# Patient Record
Sex: Female | Born: 1940 | Race: White | Hispanic: No | Smoking: Former smoker
Health system: Southern US, Community
[De-identification: ages and names within clinical notes are randomized; demographics above are authoritative.]

## PROBLEM LIST (undated history)

## (undated) DIAGNOSIS — I351 Nonrheumatic aortic (valve) insufficiency: Secondary | ICD-10-CM

## (undated) HISTORY — PX: APPENDECTOMY: SHX54

## (undated) HISTORY — PX: ADENOIDECTOMY: SUR15

## (undated) HISTORY — PX: FOOT SURGERY: SHX648

## (undated) HISTORY — PX: TONSILLECTOMY: SUR1361

---

## 2016-02-04 ENCOUNTER — Emergency Department (HOSPITAL_COMMUNITY)
Admission: EM | Admit: 2016-02-04 | Discharge: 2016-02-04 | Disposition: A | Discharge status: Home / Self Care | Payer: Medicare Other | Attending: Emergency Medicine | Admitting: Emergency Medicine

## 2016-02-04 ENCOUNTER — Emergency Department (HOSPITAL_COMMUNITY): Payer: Medicare Other

## 2016-02-04 ENCOUNTER — Encounter (HOSPITAL_COMMUNITY): Payer: Self-pay

## 2016-02-04 DIAGNOSIS — Y998 Other external cause status: Secondary | ICD-10-CM | POA: Insufficient documentation

## 2016-02-04 DIAGNOSIS — W101XXA Fall (on)(from) sidewalk curb, initial encounter: Secondary | ICD-10-CM | POA: Insufficient documentation

## 2016-02-04 DIAGNOSIS — Z8679 Personal history of other diseases of the circulatory system: Secondary | ICD-10-CM | POA: Diagnosis not present

## 2016-02-04 DIAGNOSIS — S60511A Abrasion of right hand, initial encounter: Secondary | ICD-10-CM | POA: Insufficient documentation

## 2016-02-04 DIAGNOSIS — Z23 Encounter for immunization: Secondary | ICD-10-CM | POA: Insufficient documentation

## 2016-02-04 DIAGNOSIS — T148XXA Other injury of unspecified body region, initial encounter: Secondary | ICD-10-CM

## 2016-02-04 DIAGNOSIS — Z87891 Personal history of nicotine dependence: Secondary | ICD-10-CM | POA: Diagnosis not present

## 2016-02-04 DIAGNOSIS — Y92481 Parking lot as the place of occurrence of the external cause: Secondary | ICD-10-CM | POA: Insufficient documentation

## 2016-02-04 DIAGNOSIS — Y9301 Activity, walking, marching and hiking: Secondary | ICD-10-CM | POA: Diagnosis not present

## 2016-02-04 DIAGNOSIS — S6991XA Unspecified injury of right wrist, hand and finger(s), initial encounter: Secondary | ICD-10-CM | POA: Diagnosis present

## 2016-02-04 DIAGNOSIS — W19XXXA Unspecified fall, initial encounter: Secondary | ICD-10-CM

## 2016-02-04 DIAGNOSIS — S4992XA Unspecified injury of left shoulder and upper arm, initial encounter: Secondary | ICD-10-CM | POA: Insufficient documentation

## 2016-02-04 DIAGNOSIS — M79602 Pain in left arm: Secondary | ICD-10-CM

## 2016-02-04 HISTORY — DX: Nonrheumatic aortic (valve) insufficiency: I35.1

## 2016-02-04 MED ORDER — TETANUS-DIPHTH-ACELL PERTUSSIS 5-2.5-18.5 LF-MCG/0.5 IM SUSP
0.5000 mL | Freq: Once | INTRAMUSCULAR | Status: AC
  Administered 2016-02-04: 0.5 mL via INTRAMUSCULAR
  Filled 2016-02-04: qty 0.5

## 2016-02-04 MED ORDER — BACITRACIN ZINC 500 UNIT/GM EX OINT
TOPICAL_OINTMENT | Freq: Two times a day (BID) | CUTANEOUS | Status: DC
  Administered 2016-02-04: 1 via TOPICAL
  Filled 2016-02-04: qty 0.9

## 2016-02-04 MED ORDER — ACETAMINOPHEN 325 MG PO TABS
650.0000 mg | ORAL_TABLET | Freq: Once | ORAL | Status: AC
  Administered 2016-02-04: 650 mg via ORAL
  Filled 2016-02-04: qty 2

## 2016-02-04 NOTE — ED Notes (Signed)
Pt here with fall 20 min ago.  Pt fell in parking lot.  Pt fell stepping over curb.  Landed on dirt.  Scrape to rt hand.  Pt also c/o pain to left arm.  No head injury.  No LOC

## 2016-02-04 NOTE — ED Provider Notes (Signed)
CSN: [id number]     Arrival date & time 02/04/16  4696 History   First MD Initiated Contact with Patient 02/04/16 1017     Chief Complaint  Patient presents with  . Fall  . Arm Injury     (Consider location/radiation/quality/duration/timing/severity/associated sxs/prior Treatment) Patient is a 75 y.o. female presenting with fall and arm injury.  Fall Associated symptoms include arthralgias and myalgias. Pertinent negatives include no abdominal pain, chest pain, coughing, fever, headaches, nausea, neck pain, numbness, urinary symptoms, visual change, vomiting or weakness.  Arm Injury Associated symptoms: no fever and no neck pain    Samantha Lopez is a 75 y.o. female with PMH significant for aortic valve insufficiency who presents with fall just prior to arrival. Patient states she was walking in the parking lot to visit a friend here at the hospital when she accidentally tripped over a rock while looking up at a building. She reports she landed on her knees and hands. No head injury or loss of consciousness. She now presents with a superficial wound to her right palm and constant, moderate, unchanging left arm pain. No modifying factors. No meds PTA. She denies fever, chills, chest pain, dizziness, syncope, shortness of breath.  She is unsure of her last tetanus shot.    Past Medical History  Diagnosis Date  . Aortic valve insufficiency    Past Surgical History  Procedure Laterality Date  . Appendectomy    . Foot surgery    . Tonsillectomy    . Adenoidectomy     History reviewed. No pertinent family history. Social History  Substance Use Topics  . Smoking status: Former Games developer  . Smokeless tobacco: None  . Alcohol Use: Yes     Comment: social   OB History    No data available     Review of Systems  Constitutional: Negative for fever.  Respiratory: Negative for cough.   Cardiovascular: Negative for chest pain.  Gastrointestinal: Negative for nausea, vomiting and  abdominal pain.  Musculoskeletal: Positive for myalgias and arthralgias. Negative for neck pain.  Skin: Positive for wound.  Neurological: Negative for weakness, numbness and headaches.  All other systems reviewed and are negative.     Allergies  Review of patient's allergies indicates no known allergies.  Home Medications   Prior to Admission medications   Not on File   BP 160/79 mmHg  Pulse 68  Temp(Src) 97.8 F (36.6 C) (Oral)  Resp 18  SpO2 98% Physical Exam  Constitutional: She is oriented to person, place, and time. She appears well-developed and well-nourished.  HENT:  Head: Normocephalic and atraumatic.  Right Ear: External ear normal.  Left Ear: External ear normal.  Eyes: Conjunctivae are normal. No scleral icterus.  Neck: No tracheal deviation present.  Cardiovascular:  Pulses:      Radial pulses are 2+ on the right side, and 2+ on the left side.  Capillary refill less than 3 second x 10.  Pulmonary/Chest: Effort normal. No respiratory distress.  Abdominal: She exhibits no distension.  Musculoskeletal:       Right shoulder: Normal.       Left shoulder: She exhibits decreased range of motion (secondary to pain), tenderness and pain. She exhibits no bony tenderness, no swelling, no crepitus, no deformity, no spasm, normal pulse and normal strength.       Right elbow: Normal.      Left elbow: Normal. No tenderness found. No radial head, no medial epicondyle, no lateral epicondyle and no olecranon process  tenderness noted.       Right wrist: She exhibits normal range of motion, no tenderness, no bony tenderness, no swelling and no deformity.       Left wrist: Normal.       Right knee: Normal.       Left knee: Normal.  Left shoulder: -No obvious deformities, swelling, ecchymosis, abrasions, or erythema. -No bony tenderness over clavicle, AC joint, or humeral head.  Mild tenderness over deltoid muscle. -Decreased AROM in flexion and abduction secondary to pain.   Neurological: She is alert and oriented to person, place, and time.  Strength and sensation intact bilaterally in upper extremities.   Skin: Skin is warm and dry.  Superficial abrasion noted on right proximal palm. No signs of infection. No foreign bodies visualized or palpated. Superficial linear abrasion seen on the left elbow without signs of infection, and no active bleeding.  Psychiatric: She has a normal mood and affect. Her behavior is normal.    ED Course  Procedures (including critical care time) Labs Review Labs Reviewed - No data to display  Imaging Review Dg Shoulder Left  02/04/2016  CLINICAL DATA:  Status post trip and fall this morning with a left shoulder injury. Pain. Initial encounter. EXAM: LEFT SHOULDER - 2+ VIEW COMPARISON:  None. FINDINGS: There is no acute bony or joint abnormality. No notable degenerative changes seen about the shoulder. Imaged left lung and ribs are clear. IMPRESSION: Negative exam. Electronically Signed   By: Drusilla Kannerhomas  Dalessio M.D.   On: 02/04/2016 10:55   Dg Humerus Left  02/04/2016  CLINICAL DATA:  Tripped and fell in parking lot. EXAM: LEFT HUMERUS - 2+ VIEW COMPARISON:  02/04/2016 FINDINGS: There is no evidence of fracture or other focal bone lesions. Soft tissues are unremarkable. IMPRESSION: Negative. Electronically Signed   By: Signa Kellaylor  Stroud M.D.   On: 02/04/2016 10:55   I have personally reviewed and evaluated these images and lab results as part of my medical decision-making.   EKG Interpretation None      MDM   Final diagnoses:  Fall, initial encounter  Abrasion  Left arm pain   Patient presents with mechanical fall now with superficial abrasion to right palm and left arm pain. No chest pain, shortness breath, dizziness, syncope. VSS, NAD. NVI distal to injuries.  No signs of infection.  Wounds cleaned, bacitracin applied, and dressed.  Tetanus updated. Plain films of left shoulder negative.  Recommend ibuprofen or Tylenol for  pain. Follow-up with orthopedics for persistent symptoms. Discussed return precautions. Patient agrees an Field seismologistimmunologist the above plan for discharge.    Samantha FowlerKayla Zeniyah Peaster, PA-C 02/04/16 1145  Rolland PorterMark James, MD 02/05/16 [phone]

## 2016-02-04 NOTE — Discharge Instructions (Signed)
1. Continue home medications.  Xrays from today do not show evidence of any fracture or dislocation. 2. Start taking Tylenol or Ibuprofen for pain.  DO NOT take more than 4g of Tylenol in 24 hours.  You may take 800 mg ibuprofen up to three times a day. 3.  Follow up with your PCP in 1 week for re-evaluation.  If you were given follow up with a specialist, please follow up with them accordingly.    Musculoskeletal Pain Musculoskeletal pain is muscle and boney aches and pains. These pains can occur in any part of the body. Your caregiver may treat you without knowing the cause of the pain. They may treat you if blood or urine tests, X-rays, and other tests were normal.  CAUSES There is often not a definite cause or reason for these pains. These pains may be caused by a type of germ (virus). The discomfort may also come from overuse. Overuse includes working out too hard when your body is not fit. Boney aches also come from weather changes. Bone is sensitive to atmospheric pressure changes. HOME CARE INSTRUCTIONS   Ask when your test results will be ready. Make sure you get your test results.  Only take over-the-counter or prescription medicines for pain, discomfort, or fever as directed by your caregiver. If you were given medications for your condition, do not drive, operate machinery or power tools, or sign legal documents for 24 hours. Do not drink alcohol. Do not take sleeping pills or other medications that may interfere with treatment.  Continue all activities unless the activities cause more pain. When the pain lessens, slowly resume normal activities. Gradually increase the intensity and duration of the activities or exercise.  During periods of severe pain, bed rest may be helpful. Lay or sit in any position that is comfortable.  Putting ice on the injured area.  Put ice in a bag.  Place a towel between your skin and the bag.  Leave the ice on for 15 to 20 minutes, 3 to 4 times a  day.  Follow up with your caregiver for continued problems and no reason can be found for the pain. If the pain becomes worse or does not go away, it may be necessary to repeat tests or do additional testing. Your caregiver may need to look further for a possible cause. SEEK IMMEDIATE MEDICAL CARE IF:  You have pain that is getting worse and is not relieved by medications.  You develop chest pain that is associated with shortness or breath, sweating, feeling sick to your stomach (nauseous), or throw up (vomit).  Your pain becomes localized to the abdomen.  You develop any new symptoms that seem different or that concern you. MAKE SURE YOU:   Understand these instructions.  Will watch your condition.  Will get help right away if you are not doing well or get worse.   This information is not intended to replace advice given to you by your health care provider. Make sure you discuss any questions you have with your health care provider.   Document Released: 10/26/2005 Document Revised: 01/18/2012 Document Reviewed: 06/30/2013 Elsevier Interactive Patient Education [employer]2016 [employer]

## 2017-05-27 IMAGING — CR DG HUMERUS 2V *L*
2 series · 2 of 2 positions shown · non-contrast
Comparison: 02/04/2016

CLINICAL DATA: Tripped and fell in parking lot.

EXAM:
LEFT HUMERUS - 2+ VIEW

[w humerus ap left]
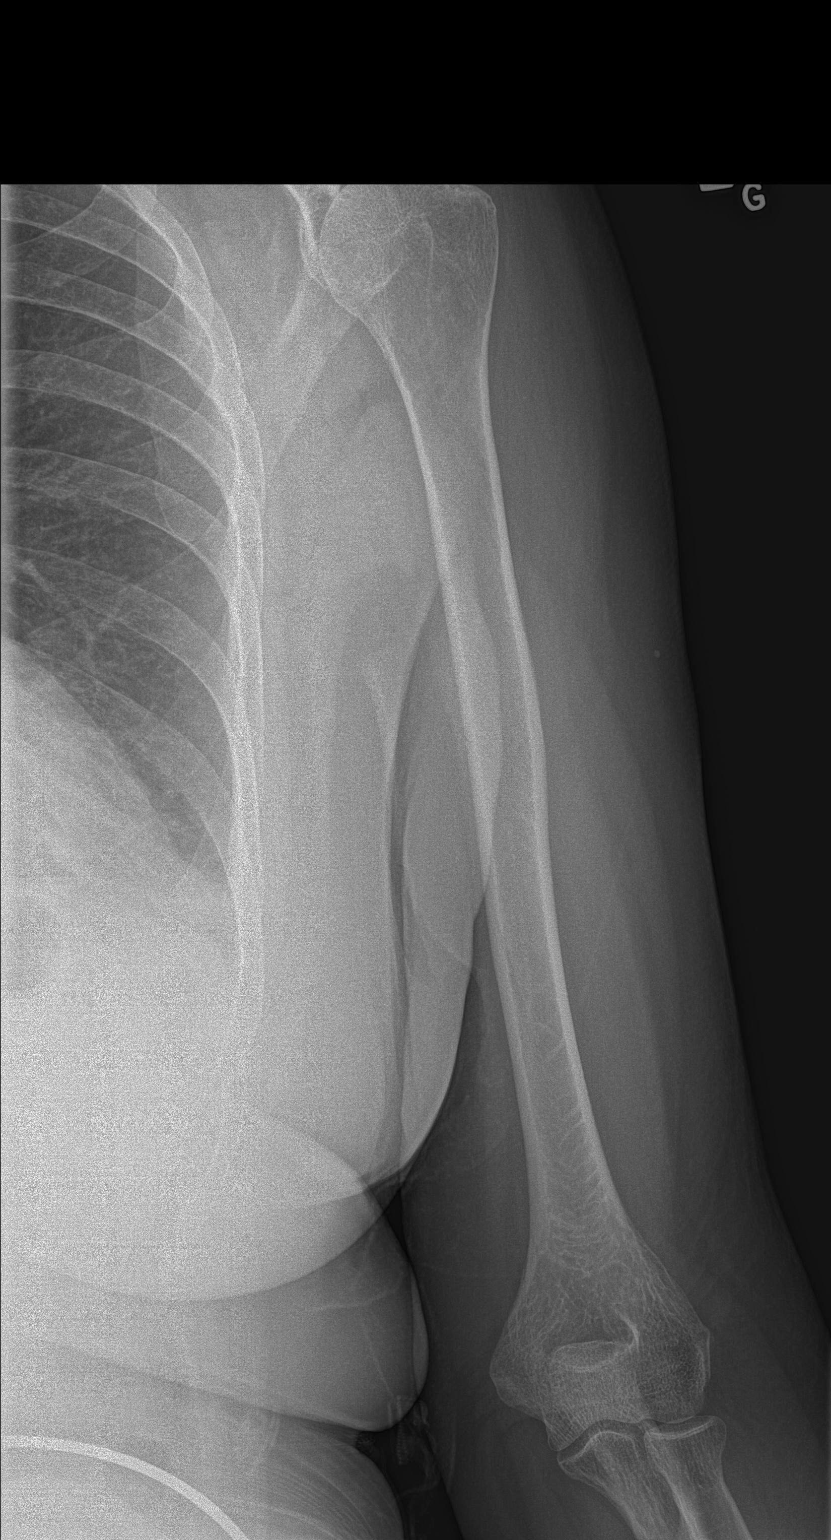

[w humerus lat left]
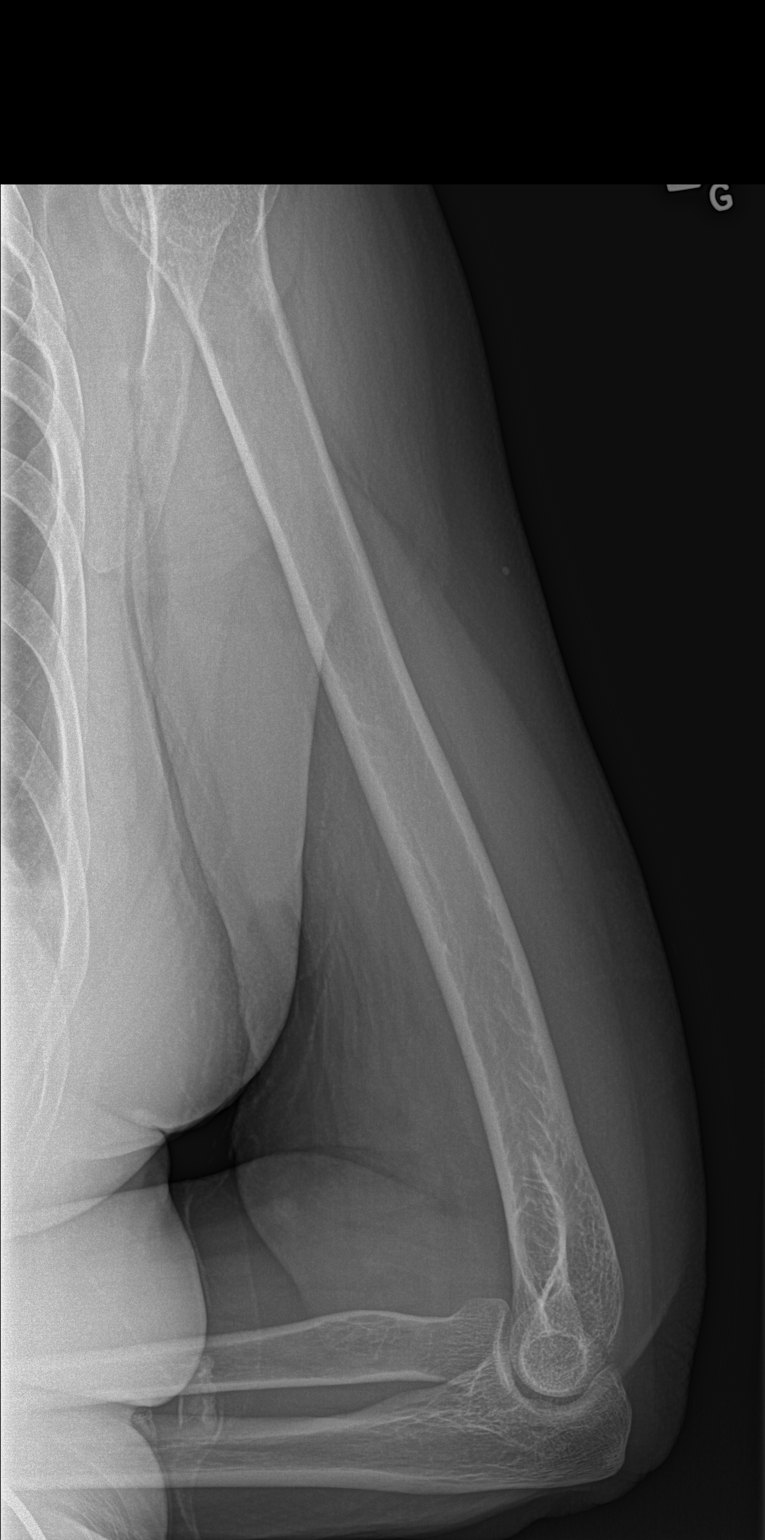

[2 of 2 positions shown; findings below may reference images not displayed]

FINDINGS: There is no evidence of fracture or other focal bone lesions. Soft
tissues are unremarkable.
IMPRESSION: Negative.
